# Patient Record
Sex: Male | Born: 1985 | Race: White | Hispanic: No | Marital: Married | Smoking: Never smoker
Health system: Southern US, Community
[De-identification: ages and names within clinical notes are randomized; demographics above are authoritative.]

---

## 2020-06-04 ENCOUNTER — Emergency Department (HOSPITAL_COMMUNITY)
Admission: EM | Admit: 2020-06-04 | Discharge: 2020-06-05 | Disposition: A | Discharge status: Home / Self Care | Payer: HRSA Program | Attending: Emergency Medicine | Admitting: Emergency Medicine

## 2020-06-04 ENCOUNTER — Ambulatory Visit (HOSPITAL_COMMUNITY)
Admission: EM | Admit: 2020-06-04 | Discharge: 2020-06-04 | Disposition: A | Discharge status: Home / Self Care | Payer: Self-pay

## 2020-06-04 ENCOUNTER — Other Ambulatory Visit: Payer: Self-pay

## 2020-06-04 ENCOUNTER — Encounter (HOSPITAL_COMMUNITY): Payer: Self-pay | Admitting: Emergency Medicine

## 2020-06-04 ENCOUNTER — Emergency Department (HOSPITAL_COMMUNITY): Payer: HRSA Program

## 2020-06-04 ENCOUNTER — Encounter (HOSPITAL_COMMUNITY): Payer: Self-pay

## 2020-06-04 DIAGNOSIS — J1282 Pneumonia due to coronavirus disease 2019: Secondary | ICD-10-CM | POA: Diagnosis not present

## 2020-06-04 DIAGNOSIS — U071 COVID-19: Secondary | ICD-10-CM | POA: Insufficient documentation

## 2020-06-04 DIAGNOSIS — R059 Cough, unspecified: Secondary | ICD-10-CM | POA: Diagnosis present

## 2020-06-04 LAB — CBC
HCT: 49.7 % (ref 39.0–52.0)
Hemoglobin: 16.6 g/dL (ref 13.0–17.0)
MCH: 30.1 pg (ref 26.0–34.0)
MCHC: 33.4 g/dL (ref 30.0–36.0)
MCV: 90 fL (ref 80.0–100.0)
Platelets: 120 10*3/uL — ABNORMAL LOW (ref 150–400)
RBC: 5.52 MIL/uL (ref 4.22–5.81)
RDW: 13 % (ref 11.5–15.5)
WBC: 6.2 10*3/uL (ref 4.0–10.5)
nRBC: 0 % (ref 0.0–0.2)

## 2020-06-04 LAB — URINALYSIS, ROUTINE W REFLEX MICROSCOPIC
Bilirubin Urine: NEGATIVE
Glucose, UA: NEGATIVE mg/dL
Hgb urine dipstick: NEGATIVE
Ketones, ur: NEGATIVE mg/dL
Leukocytes,Ua: NEGATIVE
Nitrite: NEGATIVE
Protein, ur: NEGATIVE mg/dL
Specific Gravity, Urine: 1.017 (ref 1.005–1.030)
pH: 6 (ref 5.0–8.0)

## 2020-06-04 LAB — BASIC METABOLIC PANEL
Anion gap: 10 (ref 5–15)
BUN: 8 mg/dL (ref 6–20)
CO2: 25 mmol/L (ref 22–32)
Calcium: 8.7 mg/dL — ABNORMAL LOW (ref 8.9–10.3)
Chloride: 98 mmol/L (ref 98–111)
Creatinine, Ser: 1.11 mg/dL (ref 0.61–1.24)
GFR, Estimated: >60 mL/min (ref >60)
Glucose, Bld: 111 mg/dL — ABNORMAL HIGH (ref 70–99)
Potassium: 4.2 mmol/L (ref 3.5–5.1)
Sodium: 133 mmol/L — ABNORMAL LOW (ref 135–145)

## 2020-06-04 MED ORDER — ALBUTEROL SULFATE HFA 108 (90 BASE) MCG/ACT IN AERS
4.0000 | INHALATION_SPRAY | Freq: Once | RESPIRATORY_TRACT | Status: AC
  Administered 2020-06-05: 4 via RESPIRATORY_TRACT
  Filled 2020-06-04: qty 6.7

## 2020-06-04 MED ORDER — HYDROCOD POLST-CPM POLST ER 10-8 MG/5ML PO SUER
5.0000 mL | Freq: Once | ORAL | Status: AC
  Administered 2020-06-05: 5 mL via ORAL
  Filled 2020-06-04: qty 5

## 2020-06-04 MED ORDER — SODIUM CHLORIDE 0.9 % IV SOLN: Freq: Once | INTRAVENOUS | Status: DC

## 2020-06-04 MED ORDER — METHYLPREDNISOLONE SODIUM SUCC 125 MG IJ SOLR: 125.0000 mg | Freq: Once | INTRAMUSCULAR | Status: DC | PRN

## 2020-06-04 MED ORDER — FAMOTIDINE IN NACL 20-0.9 MG/50ML-% IV SOLN: 20.0000 mg | Freq: Once | INTRAVENOUS | Status: DC | PRN

## 2020-06-04 MED ORDER — EPINEPHRINE 0.3 MG/0.3ML IJ SOAJ: 0.3000 mg | Freq: Once | INTRAMUSCULAR | Status: DC | PRN

## 2020-06-04 MED ORDER — ACETAMINOPHEN 325 MG PO TABS
650.0000 mg | ORAL_TABLET | Freq: Once | ORAL | Status: AC
  Administered 2020-06-04: 650 mg via ORAL

## 2020-06-04 MED ORDER — SODIUM CHLORIDE 0.9 % IV SOLN
Freq: Once | INTRAVENOUS | Status: DC
  Filled 2020-06-04: qty 5

## 2020-06-04 MED ORDER — ALBUTEROL SULFATE HFA 108 (90 BASE) MCG/ACT IN AERS: 2.0000 | INHALATION_SPRAY | Freq: Once | RESPIRATORY_TRACT | Status: DC | PRN

## 2020-06-04 MED ORDER — SODIUM CHLORIDE 0.9 % IV SOLN
1200.0000 mg | Freq: Once | INTRAVENOUS | Status: AC
  Administered 2020-06-05: 1200 mg via INTRAVENOUS
  Filled 2020-06-04: qty 10

## 2020-06-04 MED ORDER — AEROCHAMBER PLUS FLO-VU LARGE MISC: 1.0000 | Freq: Once | Status: DC

## 2020-06-04 MED ORDER — DIPHENHYDRAMINE HCL 50 MG/ML IJ SOLN: 50.0000 mg | Freq: Once | INTRAMUSCULAR | Status: DC | PRN

## 2020-06-04 MED ORDER — ACETAMINOPHEN 325 MG PO TABS
ORAL_TABLET | ORAL | Status: AC
  Filled 2020-06-04: qty 2

## 2020-06-04 MED ORDER — SODIUM CHLORIDE 0.9 % IV SOLN: INTRAVENOUS | Status: DC | PRN

## 2020-06-04 MED ORDER — DEXAMETHASONE SODIUM PHOSPHATE 10 MG/ML IJ SOLN
10.0000 mg | Freq: Once | INTRAMUSCULAR | Status: AC
  Administered 2020-06-05: 10 mg via INTRAVENOUS
  Filled 2020-06-04: qty 1

## 2020-06-04 NOTE — ED Provider Notes (Signed)
[facility] EMERGENCY DEPARTMENT Provider Note   CSN: [id number] Arrival date & time: 06/04/20  1737     History Chief Complaint  Patient presents with  . Covid Positive    Peter Wilcox is a 34 y.o. male.  Pt presents to the ED today with sob and cough.  Pt said he has had Covid sx of cough and sob starting on 10/21.  He went to [facility] on 11/2 and tested positive for Covid.  Results in Epic.  Pt said he continues to have fever, body aches and sob.  Pt has been taking tylenol for fever.  He has not been vaccinated.        History reviewed. No pertinent past medical history.  There are no problems to display for this patient.   History reviewed. No pertinent surgical history.     No family history on file.  Social History   Tobacco Use  . Smoking status: Never Smoker  . Smokeless tobacco: Never Used  Substance Use Topics  . Alcohol use: Never  . Drug use: Never    Home Medications Prior to Admission medications   Medication Sig Start Date End Date Taking? Authorizing Provider  acetaminophen (TYLENOL) 500 MG tablet Take 500 mg by mouth every 6 (six) hours as needed.    [provider]  chlorpheniramine-HYDROcodone (TUSSIONEX PENNKINETIC ER) 10-8 MG/5ML SUER Take 5 mLs by mouth every 12 (twelve) hours as needed for cough. 06/05/20   Jacalyn Lefevre, MD  doxycycline (VIBRAMYCIN) 100 MG capsule Take 1 capsule (100 mg total) by mouth 2 (two) times daily. 06/05/20   Jacalyn Lefevre, MD  predniSONE (STERAPRED UNI-PAK 21 TAB) 10 MG (21) TBPK tablet Take 6 tabs for 2 days, then 5 for 2 days, then 4 for 2 days, then 3 for 2 days, 2 for 2 days, then 1 for 2 days 06/05/20   Jacalyn Lefevre, MD    Allergies    Patient has no known allergies.  Review of Systems   Review of Systems  Constitutional: Positive for fever.  Respiratory: Positive for cough and shortness of breath.   All other systems reviewed and are negative.   Physical Exam Updated Vital  Signs BP 112/63   Pulse (!) 112   Temp 98.9 F (37.2 C) (Oral)   Resp (!) 22   Ht 5\' 11"  (1.803 m)   Wt 90.7 kg   SpO2 95%   BMI 27.89 kg/m   Physical Exam Vitals and nursing note reviewed.  Constitutional:      Appearance: Normal appearance. He is obese.  HENT:     Head: Normocephalic and atraumatic.     Right Ear: External ear normal.     Left Ear: External ear normal.     Nose: Nose normal.     Mouth/Throat:     Mouth: Mucous membranes are moist.     Pharynx: Oropharynx is clear.  Eyes:     Extraocular Movements: Extraocular movements intact.     Conjunctiva/sclera: Conjunctivae normal.     Pupils: Pupils are equal, round, and reactive to light.  Cardiovascular:     Rate and Rhythm: Normal rate and regular rhythm.     Pulses: Normal pulses.     Heart sounds: Normal heart sounds.  Pulmonary:     Effort: Tachypnea present.     Breath sounds: Rhonchi present.  Musculoskeletal:        General: Normal range of motion.     Cervical back: Normal range of  motion and neck supple.  Skin:    General: Skin is warm.     Capillary Refill: Capillary refill takes less than 2 seconds.  Neurological:     General: No focal deficit present.     Mental Status: He is alert and oriented to person, place, and time.  Psychiatric:        Mood and Affect: Mood normal.        Behavior: Behavior normal.     ED Results / Procedures / Treatments   Labs (all labs ordered are listed, but only abnormal results are displayed) Labs Reviewed  BASIC METABOLIC PANEL - Abnormal; Notable for the following components:      Result Value   Sodium 133 (*)    Glucose, Bld 111 (*)    Calcium 8.7 (*)    All other components within normal limits  CBC - Abnormal; Notable for the following components:   Platelets 120 (*)    All other components within normal limits  URINALYSIS, ROUTINE W REFLEX MICROSCOPIC    EKG EKG Interpretation  Date/Time:  Saturday June 04 2020 18:35:05 EDT Ventricular  Rate:  110 PR Interval:  138 QRS Duration: 84 QT Interval:  336 QTC Calculation: 454 R Axis:   58 Text Interpretation: Sinus tachycardia Possible Anterior infarct , age undetermined Abnormal ECG Confirmed by Jacalyn Lefevre [phone]) on 06/04/2020 11:12:27 PM   Radiology DG Chest Portable 1 View  Result Date: 06/04/2020 CLINICAL DATA:  Shortness of breath.  COVID positive EXAM: PORTABLE CHEST 1 VIEW COMPARISON:  October 14, 2016 FINDINGS: There are hazy diffuse bilateral airspace opacities. The lung volumes are low. The heart size is unremarkable. There is no pneumothorax. IMPRESSION: Hazy diffuse bilateral airspace opacities consistent with the patient's history of viral pneumonia. Electronically Signed   By: Katherine Mantle M.D.   On: 06/04/2020 20:15    Procedures Procedures (including critical care time)  Medications Ordered in ED Medications  AeroChamber Plus Flo-Vu Large MISC 1 each (has no administration in time range)  0.9 %  sodium chloride infusion (has no administration in time range)  diphenhydrAMINE (BENADRYL) injection 50 mg (has no administration in time range)  famotidine (PEPCID) IVPB 20 mg premix (has no administration in time range)  methylPREDNISolone sodium succinate (SOLU-MEDROL) 125 mg/2 mL injection 125 mg (has no administration in time range)  albuterol (VENTOLIN HFA) 108 (90 Base) MCG/ACT inhaler 2 puff (has no administration in time range)  EPINEPHrine (EPI-PEN) injection 0.3 mg (has no administration in time range)  chlorpheniramine-HYDROcodone (TUSSIONEX) 10-8 MG/5ML suspension 5 mL (5 mLs Oral Given 06/05/20 0007)  dexamethasone (DECADRON) injection 10 mg (10 mg Intravenous Given 06/05/20 0009)  albuterol (VENTOLIN HFA) 108 (90 Base) MCG/ACT inhaler 4 puff (4 puffs Inhalation Given 06/05/20 0009)  casirivimab-imdevimab (REGEN-COV) 1,200 mg in sodium chloride 0.9 % 110 mL IVPB (0 mg Intravenous Stopped 06/05/20 0048)    ED Course  I have reviewed the triage  vital signs and the nursing notes.  Pertinent labs & imaging results that were available during my care of the patient were reviewed by me and considered in my medical decision making (see chart for details).    MDM Rules/Calculators/A&P                         Pt's oxygenation is above 92%.  Pt qualifies for the mab infusion due to BMI.  He is interested in getting this treatment.  Pt given the infusion and had  no adverse reactions.  He was observed for 1 hour after completion.    Pt is feeling better after treatment.  He is given covid instructions at d/c.  Return if worse.  F/u with pcp.  Peter Wilcox was evaluated in Emergency Department on 06/05/2020 for the symptoms described in the history of present illness. He was evaluated in the context of the global COVID-19 pandemic, which necessitated consideration that the patient might be at risk for infection with the SARS-CoV-2 virus that causes COVID-19. Institutional protocols and algorithms that pertain to the evaluation of patients at risk for COVID-19 are in a state of rapid change based on information released by regulatory bodies including the CDC and federal and state organizations. These policies and algorithms were followed during the patient's care in the ED. Final Clinical Impression(s) / ED Diagnoses Final diagnoses:  Pneumonia due to COVID-19 virus    Rx / DC Orders ED Discharge Orders         Ordered    doxycycline (VIBRAMYCIN) 100 MG capsule  2 times daily        06/05/20 0054    predniSONE (STERAPRED UNI-PAK 21 TAB) 10 MG (21) TBPK tablet        06/05/20 0054    chlorpheniramine-HYDROcodone (TUSSIONEX PENNKINETIC ER) 10-8 MG/5ML SUER  Every 12 hours PRN        06/05/20 0054           Jacalyn Lefevre, MD 06/05/20 0129

## 2020-06-04 NOTE — ED Triage Notes (Signed)
Pt from [facility].  Reports COVID + on Tuesday.  Reports fever, chills, SOB, back pain, and body aches.

## 2020-06-04 NOTE — ED Triage Notes (Addendum)
Pt presents with shortness of breath x 2-3 days, today is the worse. States last night was the first day he was able to have a  good sleep. Pt reports he is having fever the highest was 103.8 F. Pt taking Tylenol for fever, last dose around 10 am today.   Pt reports he tested positive for COVID on 05/31/2020 testes done at [facility].

## 2020-06-05 MED ORDER — DOXYCYCLINE HYCLATE 100 MG PO CAPS: 100.0000 mg | ORAL_CAPSULE | Freq: Two times a day (BID) | ORAL | 0 refills | Status: AC

## 2020-06-05 MED ORDER — PREDNISONE 10 MG (21) PO TBPK: ORAL_TABLET | ORAL | 0 refills | Status: AC

## 2020-06-05 MED ORDER — HYDROCOD POLST-CPM POLST ER 10-8 MG/5ML PO SUER: 5.0000 mL | Freq: Two times a day (BID) | ORAL | 0 refills | Status: AC | PRN

## 2020-06-05 NOTE — ED Notes (Signed)
Pt aao4, gcs15, reporting generalized body aches, fever s/s, sob. Pt spo2 96% on room air. Sinus rhythm on monitor, side rails up, call bell in reach.

## 2020-06-05 NOTE — Discharge Instructions (Signed)
Person Under Monitoring Name: Peter Wilcox  Location: [address] [place] [address]   Infection Prevention Recommendations for Individuals Confirmed to have, or Being Evaluated for, 2019 Novel Coronavirus (COVID-19) Infection Who Receive Care at Home  Individuals who are confirmed to have, or are being evaluated for, COVID-19 should follow the prevention steps below until a healthcare provider or local or state health department says they can return to normal activities.  Stay home except to get medical care You should restrict activities outside your home, except for getting medical care. Do not go to work, school, or public areas, and do not use public transportation or taxis.  Call ahead before visiting your doctor Before your medical appointment, call the healthcare provider and tell them that you have, or are being evaluated for, COVID-19 infection. This will help the healthcare provider's office take steps to keep other people from getting infected. Ask your healthcare provider to call the local or state health department.  Monitor your symptoms Seek prompt medical attention if your illness is worsening (e.g., difficulty breathing). Before going to your medical appointment, call the healthcare provider and tell them that you have, or are being evaluated for, COVID-19 infection. Ask your healthcare provider to call the local or state health department.  Wear a facemask You should wear a facemask that covers your nose and mouth when you are in the same room with other people and when you visit a healthcare provider. People who live with or visit you should also wear a facemask while they are in the same room with you.  Separate yourself from other people in your home As much as possible, you should stay in a different room from other people in your home. Also, you should use a separate bathroom, if available.  Avoid sharing household items You should not  share dishes, drinking glasses, cups, eating utensils, towels, bedding, or other items with other people in your home. After using these items, you should wash them thoroughly with soap and water.  Cover your coughs and sneezes Cover your mouth and nose with a tissue when you cough or sneeze, or you can cough or sneeze into your sleeve. Throw used tissues in a lined trash can, and immediately wash your hands with soap and water for at least 20 seconds or use an alcohol-based hand rub.  Wash your [employer] your hands often and thoroughly with soap and water for at least 20 seconds. You can use an alcohol-based hand sanitizer if soap and water are not available and if your hands are not visibly dirty. Avoid touching your eyes, nose, and mouth with unwashed hands.   Prevention Steps for Caregivers and Household Members of Individuals Confirmed to have, or Being Evaluated for, COVID-19 Infection Being Cared for in the Home  If you live with, or provide care at home for, a person confirmed to have, or being evaluated for, COVID-19 infection please follow these guidelines to prevent infection:  Follow healthcare provider's instructions Make sure that you understand and can help the patient follow any healthcare provider instructions for all care.  Provide for the patient's basic needs You should help the patient with basic needs in the home and provide support for getting groceries, prescriptions, and other personal needs.  Monitor the patient's symptoms If they are getting sicker, call his or her medical provider and tell them that the patient has, or is being evaluated for, COVID-19 infection. This will help the  healthcare provider's office take steps to keep other people from getting infected. Ask the healthcare provider to call the local or state health department.  Limit the number of people who have contact with the patient If possible, have only one caregiver for the  patient. Other household members should stay in another home or place of residence. If this is not possible, they should stay in another room, or be separated from the patient as much as possible. Use a separate bathroom, if available. Restrict visitors who do not have an essential need to be in the home.  Keep older adults, very young children, and other sick people away from the patient Keep older adults, very young children, and those who have compromised immune systems or chronic health conditions away from the patient. This includes people with chronic heart, lung, or kidney conditions, diabetes, and cancer.  Ensure good ventilation Make sure that shared spaces in the home have good air flow, such as from an air conditioner or an opened window, weather permitting.  Wash your hands often Wash your hands often and thoroughly with soap and water for at least 20 seconds. You can use an alcohol based hand sanitizer if soap and water are not available and if your hands are not visibly dirty. Avoid touching your eyes, nose, and mouth with unwashed hands. Use disposable paper towels to dry your hands. If not available, use dedicated cloth towels and replace them when they become wet.  Wear a facemask and gloves Wear a disposable facemask at all times in the room and gloves when you touch or have contact with the patient's blood, body fluids, and/or secretions or excretions, such as sweat, saliva, sputum, nasal mucus, vomit, urine, or feces.  Ensure the mask fits over your nose and mouth tightly, and do not touch it during use. Throw out disposable facemasks and gloves after using them. Do not reuse. Wash your hands immediately after removing your facemask and gloves. If your personal clothing becomes contaminated, carefully remove clothing and launder. Wash your hands after handling contaminated clothing. Place all used disposable facemasks, gloves, and other waste in a lined container before  disposing them with other household waste. Remove gloves and wash your hands immediately after handling these items.  Do not share dishes, glasses, or other household items with the patient Avoid sharing household items. You should not share dishes, drinking glasses, cups, eating utensils, towels, bedding, or other items with a patient who is confirmed to have, or being evaluated for, COVID-19 infection. After the person uses these items, you should wash them thoroughly with soap and water.  Wash laundry thoroughly Immediately remove and wash clothes or bedding that have blood, body fluids, and/or secretions or excretions, such as sweat, saliva, sputum, nasal mucus, vomit, urine, or feces, on them. Wear gloves when handling laundry from the patient. Read and follow directions on labels of laundry or clothing items and detergent. In general, wash and dry with the warmest temperatures recommended on the label.  Clean all areas the individual has used often Clean all touchable surfaces, such as counters, tabletops, doorknobs, bathroom fixtures, toilets, phones, keyboards, tablets, and bedside tables, every day. Also, clean any surfaces that may have blood, body fluids, and/or secretions or excretions on them. Wear gloves when cleaning surfaces the patient has come in contact with. Use a diluted bleach solution (e.g., dilute bleach with 1 part bleach and 10 parts water) or a household disinfectant with a label that says EPA-registered for coronaviruses. To  make a bleach solution at home, add 1 tablespoon of bleach to 1 quart (4 cups) of water. For a larger supply, add  cup of bleach to 1 gallon (16 cups) of water. Read labels of cleaning products and follow recommendations provided on product labels. Labels contain instructions for safe and effective use of the cleaning product including precautions you should take when applying the product, such as wearing gloves or eye protection and making sure you  have good ventilation during use of the product. Remove gloves and wash hands immediately after cleaning.  Monitor yourself for signs and symptoms of illness Caregivers and household members are considered close contacts, should monitor their health, and will be asked to limit movement outside of the home to the extent possible. Follow the monitoring steps for close contacts listed on the symptom monitoring form.   ? If you have additional questions, contact your local health department or call the epidemiologist on call at [phone] (available 24/7). ? This guidance is subject to change. For the most up-to-date guidance from [facility], please refer to their website: [email or web address].pl

## 2021-07-27 IMAGING — CR DG CHEST 1V PORT
1 series · 1 of 1 positions shown · non-contrast
Comparison: October 14, 2016

CLINICAL DATA: Shortness of breath.  COVID positive

EXAM:
PORTABLE CHEST 1 VIEW

[AP]
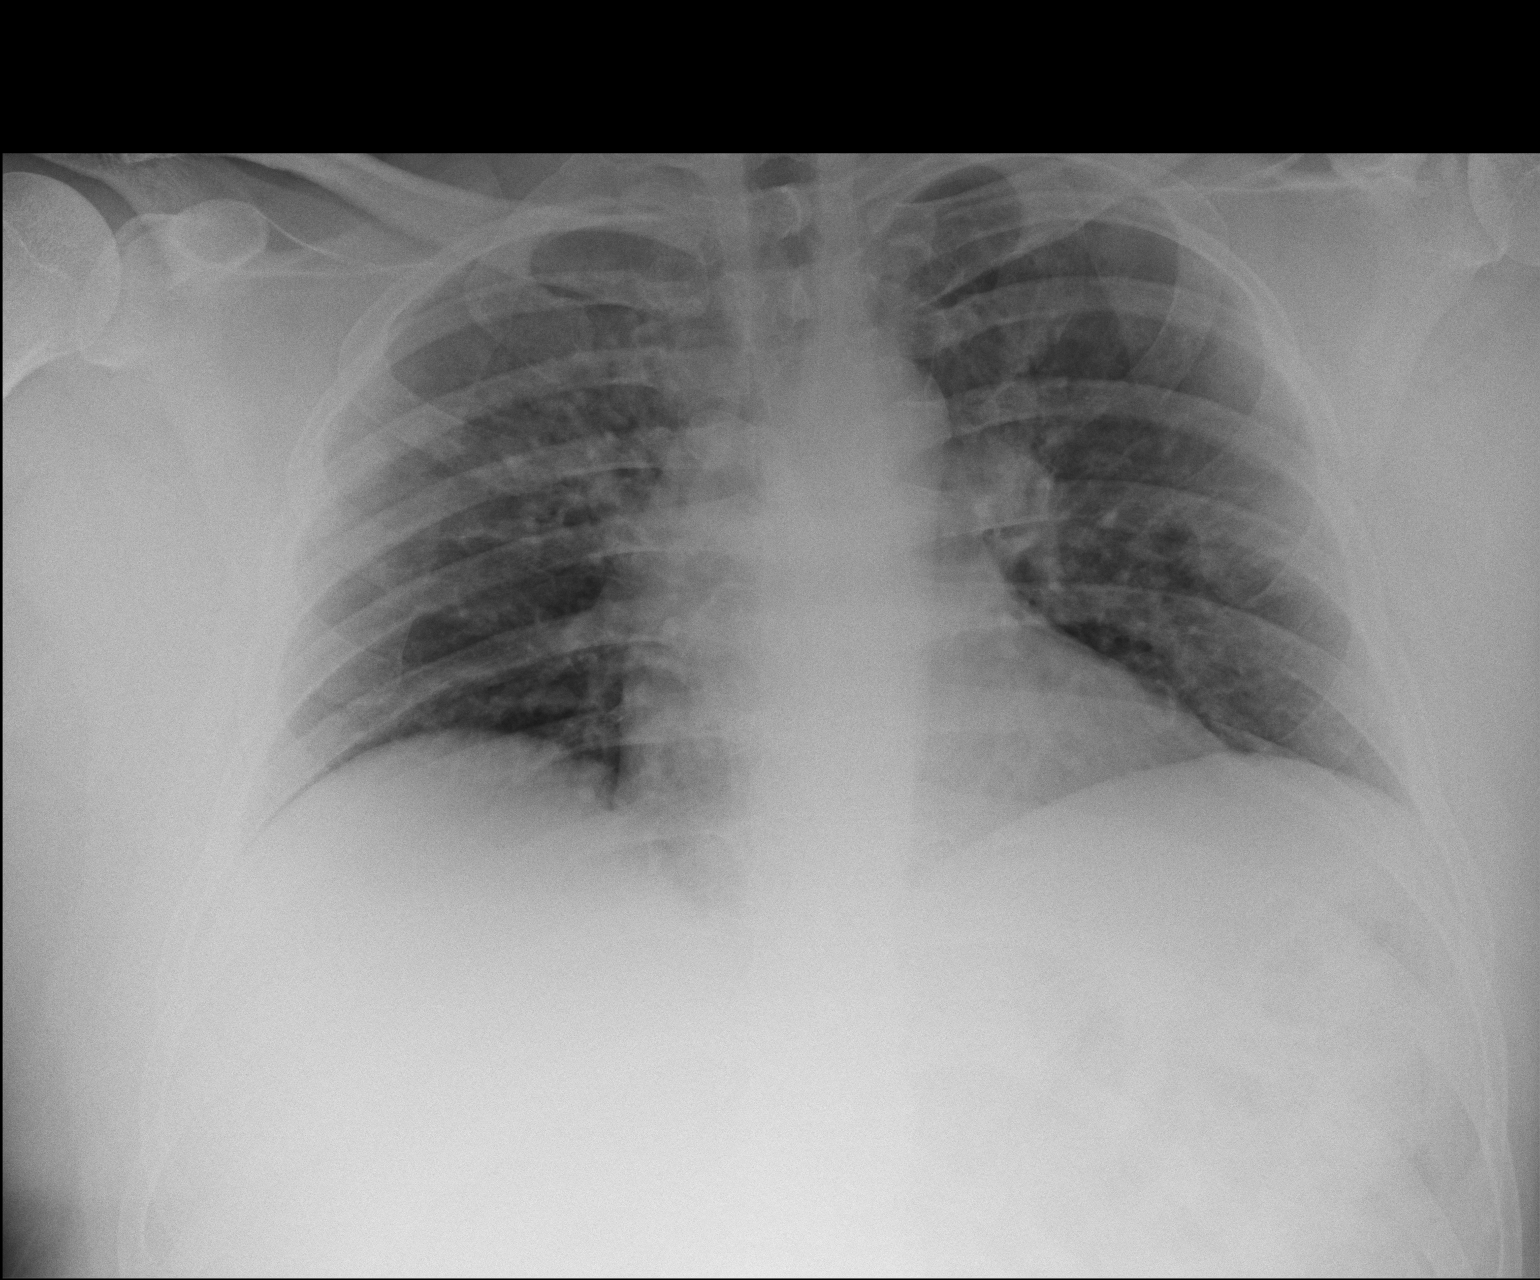

[1 of 1 positions shown; findings below may reference images not displayed]

FINDINGS: There are hazy diffuse bilateral airspace opacities. The lung
volumes are low. The heart size is unremarkable. There is no
pneumothorax.
IMPRESSION: Hazy diffuse bilateral airspace opacities consistent with the
patient's history of viral pneumonia.
